# Patient Record
Sex: Male | Born: 1990 | Race: White | Hispanic: No | Smoking: Never smoker
Health system: Southern US, Community
[De-identification: ages and names within clinical notes are randomized; demographics above are authoritative.]

## PROBLEM LIST (undated history)

## (undated) DIAGNOSIS — E119 Type 2 diabetes mellitus without complications: Secondary | ICD-10-CM

## (undated) DIAGNOSIS — I10 Essential (primary) hypertension: Secondary | ICD-10-CM

## (undated) DIAGNOSIS — R569 Unspecified convulsions: Secondary | ICD-10-CM

## (undated) HISTORY — PX: FOREIGN BODY REMOVAL: SHX962

## (undated) HISTORY — PX: ANKLE SURGERY: SHX546

## (undated) HISTORY — PX: LEG SURGERY: SHX1003

---

## 2019-02-04 ENCOUNTER — Other Ambulatory Visit: Payer: Self-pay

## 2019-02-04 ENCOUNTER — Emergency Department (HOSPITAL_COMMUNITY)
Admission: EM | Admit: 2019-02-04 | Discharge: 2019-02-04 | Disposition: A | Discharge status: Home / Self Care | Payer: Self-pay | Attending: Emergency Medicine | Admitting: Emergency Medicine

## 2019-02-04 ENCOUNTER — Encounter (HOSPITAL_COMMUNITY): Payer: Self-pay | Admitting: Emergency Medicine

## 2019-02-04 DIAGNOSIS — L02512 Cutaneous abscess of left hand: Secondary | ICD-10-CM | POA: Insufficient documentation

## 2019-02-04 DIAGNOSIS — L0291 Cutaneous abscess, unspecified: Secondary | ICD-10-CM

## 2019-02-04 MED ORDER — DOXYCYCLINE HYCLATE 100 MG PO CAPS: 100.0000 mg | ORAL_CAPSULE | Freq: Two times a day (BID) | ORAL | 0 refills | Status: AC

## 2019-02-04 MED ORDER — LIDOCAINE HCL (PF) 2 % IJ SOLN
INTRAMUSCULAR | Status: AC
  Administered 2019-02-04: 5 mL via INTRADERMAL
  Filled 2019-02-04: qty 10

## 2019-02-04 MED ORDER — LIDOCAINE HCL (PF) 2 % IJ SOLN
5.0000 mL | Freq: Once | INTRAMUSCULAR | Status: AC
  Administered 2019-02-04: 5 mL via INTRADERMAL

## 2019-02-04 MED ORDER — HYDROCODONE-ACETAMINOPHEN 5-325 MG PO TABS: ORAL_TABLET | ORAL | 0 refills | Status: AC

## 2019-02-04 MED ORDER — POVIDONE-IODINE 10 % EX SOLN
CUTANEOUS | Status: DC | PRN
  Administered 2019-02-04: 1 via TOPICAL
  Filled 2019-02-04: qty 15

## 2019-02-04 NOTE — Discharge Instructions (Addendum)
Elevate your hand and apply warm wet compresses or warm water soaks 2-3 times a day.  Keep the area bandaged.  The antibiotic as directed until its finished.  Return to the ER for any worsening symptoms such as fever, chills, red streaking or increasing pain or swelling.

## 2019-02-04 NOTE — ED Triage Notes (Signed)
Pt c/o of abscess on left hand with swelling and redness.  Pt states " I think its a spider that bit me"

## 2019-02-04 NOTE — ED Notes (Signed)
Redness raise area to left hand with small scab area noted.

## 2019-02-05 NOTE — ED Provider Notes (Signed)
[facility] EMERGENCY DEPARTMENT Provider Note   CSN: [id number] Arrival date & time: 02/04/19  1100    History   Chief Complaint Chief Complaint  Patient presents with  . Abscess    HPI Mike Bennett is a 28 y.o. male.     HPI   Mike Bennett is a 28 y.o. male who presents to the Emergency Department complaining of a red raised area to the dorsal left hand.  States the area has been gradually increasing in size and pain for several days.  He states initially he woke up noticing the area to his hand and believes that he was bitten by a spider during his sleep.  He has been squeezing the area and attempted to drain the raised area with a razor blade.  He states since that time the area has increased in size.  He denies drainage, fever, chills, pain to his fingers or wrist, and difficulty moving his fingers or gripping.    History reviewed. No pertinent past medical history.  There are no active problems to display for this patient.   Past Surgical History:  Procedure Laterality Date  . ANKLE SURGERY    . FOREIGN BODY REMOVAL    . LEG SURGERY          Home Medications    Prior to Admission medications   Medication Sig Start Date End Date Taking? Authorizing Provider  doxycycline (VIBRAMYCIN) 100 MG capsule Take 1 capsule (100 mg total) by mouth 2 (two) times daily. 02/04/19   Keymora Grillot, PA-C  HYDROcodone-acetaminophen (NORCO/VICODIN) 5-325 MG tablet Take one tab po q 4 hrs prn pain 02/04/19   Pauline Aus, PA-C    Family History History reviewed. No pertinent family history.  Social History Social History   Tobacco Use  . Smoking status: Never Smoker  . Smokeless tobacco: Never Used  Substance Use Topics  . Alcohol use: Never    Frequency: Never  . Drug use: Yes    Types: Marijuana     Allergies   Patient has no allergy information on record.   Review of Systems Review of Systems  Constitutional: Negative for chills and fever.    Gastrointestinal: Negative for nausea and vomiting.  Musculoskeletal: Negative for arthralgias and joint swelling.       Raised red area of the dorsal left hand  Neurological: Negative for weakness and numbness.  Hematological: Negative for adenopathy.     Physical Exam Updated Vital Signs BP 124/71 (BP Location: Right Arm)   Pulse 84   Temp 98.1 F (36.7 C) (Oral)   Resp 16   Ht 6\' 1"  (1.854 m)   Wt 108.9 kg   SpO2 96%   BMI 31.66 kg/m   Physical Exam Vitals signs and nursing note reviewed.  Constitutional:      General: He is not in acute distress.    Appearance: He is well-developed. He is not ill-appearing.  Cardiovascular:     Rate and Rhythm: Normal rate and regular rhythm.     Pulses: Normal pulses.     Heart sounds: No murmur.  Pulmonary:     Effort: Pulmonary effort is normal. No respiratory distress.     Breath sounds: Normal breath sounds.  Musculoskeletal: Normal range of motion.        General: Tenderness present. No deformity.     Comments: 3 cm area of erythema and induration to the first webspace of the dorsal left hand.  Central scabbing is present.  No fluctuance.  Patient has full range of motion of the left wrist and fingers.  Normal finger thumb opposition.  No lymphangitis  Skin:    General: Skin is warm.  Neurological:     General: No focal deficit present.     Mental Status: He is alert.     Sensory: No sensory deficit.     Motor: No abnormal muscle tone.      ED Treatments / Results  Labs (all labs ordered are listed, but only abnormal results are displayed) Labs Reviewed - No data to display  EKG None  Radiology No results found.  Procedures Procedures (including critical care time)  INCISION AND DRAINAGE Performed by: Aarnav Steagall Consent: Verbal consent obtained. Risks and benefits: risks, benefits and alternatives were discussed Type: abscess  Body area: Left hand  Anesthesia: local infiltration  Incision was made  with a #11 scalpel.  Local anesthetic: lidocaine 2% % without epinephrine  Anesthetic total: 2 ml  Complexity: complex Blunt dissection to break up loculations  Drainage: purulent  Drainage amount: Small  Packing material: None Patient tolerance: Patient tolerated the procedure well with no immediate complications.     Medications Ordered in ED Medications  lidocaine (XYLOCAINE) 2 % injection 5 mL (5 mLs Intradermal Given 02/04/19 1229)     Initial Impression / Assessment and Plan / ED Course  I have reviewed the triage vital signs and the nursing notes.  Pertinent labs & imaging results that were available during my care of the patient were reviewed by me and considered in my medical decision making (see chart for details).        Small abscess to the dorsal left hand.  Appears localized.  No surrounding erythema.  Remains neurovascularly intact and he has full range of motion of the fingers and wrist.  Doubt tendon involvement.  Patient agrees to elevate, warm compresses, and antibiotics, he appears appropriate for discharge home and agrees to close outpatient follow-up.  Return precautions discussed.  Final Clinical Impressions(s) / ED Diagnoses   Final diagnoses:  Abscess    ED Discharge Orders         Ordered    doxycycline (VIBRAMYCIN) 100 MG capsule  2 times daily     02/04/19 1317    HYDROcodone-acetaminophen (NORCO/VICODIN) 5-325 MG tablet     02/04/19 1317           Pauline Aus, PA-C 02/05/19 2116    Benjiman Core, MD 02/07/19 [id number]

## 2019-02-11 ENCOUNTER — Emergency Department (HOSPITAL_COMMUNITY): Payer: Self-pay

## 2019-02-11 ENCOUNTER — Encounter (HOSPITAL_COMMUNITY): Payer: Self-pay | Admitting: Emergency Medicine

## 2019-02-11 ENCOUNTER — Other Ambulatory Visit: Payer: Self-pay

## 2019-02-11 ENCOUNTER — Emergency Department (HOSPITAL_COMMUNITY)
Admission: EM | Admit: 2019-02-11 | Discharge: 2019-02-12 | Disposition: A | Discharge status: Home / Self Care | Payer: Self-pay | Attending: Emergency Medicine | Admitting: Emergency Medicine

## 2019-02-11 DIAGNOSIS — E119 Type 2 diabetes mellitus without complications: Secondary | ICD-10-CM | POA: Insufficient documentation

## 2019-02-11 DIAGNOSIS — R51 Headache: Secondary | ICD-10-CM | POA: Insufficient documentation

## 2019-02-11 DIAGNOSIS — G40909 Epilepsy, unspecified, not intractable, without status epilepticus: Secondary | ICD-10-CM

## 2019-02-11 DIAGNOSIS — Z79899 Other long term (current) drug therapy: Secondary | ICD-10-CM | POA: Insufficient documentation

## 2019-02-11 DIAGNOSIS — S0993XA Unspecified injury of face, initial encounter: Secondary | ICD-10-CM

## 2019-02-11 DIAGNOSIS — I10 Essential (primary) hypertension: Secondary | ICD-10-CM | POA: Insufficient documentation

## 2019-02-11 DIAGNOSIS — R569 Unspecified convulsions: Secondary | ICD-10-CM | POA: Insufficient documentation

## 2019-02-11 HISTORY — DX: Type 2 diabetes mellitus without complications: E11.9

## 2019-02-11 HISTORY — DX: Unspecified convulsions: R56.9

## 2019-02-11 HISTORY — DX: Essential (primary) hypertension: I10

## 2019-02-11 MED ORDER — IBUPROFEN 400 MG PO TABS: 400.0000 mg | ORAL_TABLET | Freq: Four times a day (QID) | ORAL | 0 refills | Status: AC | PRN

## 2019-02-11 MED ORDER — IBUPROFEN 400 MG PO TABS
400.0000 mg | ORAL_TABLET | Freq: Once | ORAL | Status: AC
  Administered 2019-02-11: 400 mg via ORAL
  Filled 2019-02-11: qty 1

## 2019-02-11 MED ORDER — LEVETIRACETAM 500 MG PO TABS: 500.0000 mg | ORAL_TABLET | Freq: Two times a day (BID) | ORAL | 1 refills | Status: AC

## 2019-02-11 NOTE — ED Provider Notes (Signed)
[facility] EMERGENCY DEPARTMENT Provider Note   CSN: [id number] Arrival date & time: 02/11/19  1929    History   Chief Complaint Chief Complaint  Patient presents with  . Fall    HPI Mike Bennett is a 28 y.o. male.     HPI  The patient is a 28 year old male who admittedly states that he does not take his seizure medication because he does not like the way it makes him feel, he gets nauseated and throws up thus he has never taken any medications in quite some time.  He reports a history of being incarcerated in [place], states that he was kicked out of the state by Patent examiner and told never to return, he had been jailed here briefly for several months but is now out, overall doing well but still gets intermittent seizures.  He reports that these occur approximately twice a month and this evening he had a seizure which she describes as a petit mall seizure while he was sitting at the computer chair.  The next thing he knows he was on the floor waking up with pain at the left temporomandibular joint.  He denies headache vomitings blurred vision or any other complaints.  The pain is persistent and worse with opening and closing the mouth.  Past Medical History:  Diagnosis Date  . Diabetes mellitus without complication (HCC)   . Hypertension   . Seizures (HCC)     There are no active problems to display for this patient.   Past Surgical History:  Procedure Laterality Date  . ANKLE SURGERY    . FOREIGN BODY REMOVAL    . LEG SURGERY          Home Medications    Prior to Admission medications   Medication Sig Start Date End Date Taking? Authorizing Provider  doxycycline (VIBRAMYCIN) 100 MG capsule Take 1 capsule (100 mg total) by mouth 2 (two) times daily. 02/04/19  Yes Triplett, Tammy, PA-C  HYDROcodone-acetaminophen (NORCO/VICODIN) 5-325 MG tablet Take one tab po q 4 hrs prn pain 02/04/19   Triplett, Tammy, PA-C  ibuprofen (ADVIL,MOTRIN) 400 MG tablet Take 1 tablet  (400 mg total) by mouth every 6 (six) hours as needed. 02/11/19   Eber Hong, MD  levETIRAcetam (KEPPRA) 500 MG tablet Take 1 tablet (500 mg total) by mouth 2 (two) times daily. 02/11/19   Eber Hong, MD    Family History History reviewed. No pertinent family history.  Social History Social History   Tobacco Use  . Smoking status: Never Smoker  . Smokeless tobacco: Never Used  Substance Use Topics  . Alcohol use: Never    Frequency: Never  . Drug use: Yes    Types: Marijuana     Allergies   Morphine and related and Sulfa antibiotics   Review of Systems Review of Systems  All other systems reviewed and are negative.    Physical Exam Updated Vital Signs BP 134/83 (BP Location: Right Arm)   Pulse 85   Temp 98 F (36.7 C) (Oral)   Resp 17   Ht 1.829 m (6')   Wt 99.8 kg   SpO2 97%   BMI 29.84 kg/m   Physical Exam Vitals signs and nursing note reviewed.  Constitutional:      General: He is not in acute distress.    Appearance: He is well-developed.  HENT:     Head: Normocephalic and atraumatic.     Comments: No signs of outward trauma, the patient is able to  completely open and close the mouth without difficulty, he does not have any clicking or abnormal appearance of the jaw when he opens and closes, no obvious malocclusion though he does state that things are not lining up as they normally do.  He has tenderness over the left temporomandibular joint with opening and closing and when he is closed with palpation.    Mouth/Throat:     Pharynx: No oropharyngeal exudate.  Eyes:     General: No scleral icterus.       Right eye: No discharge.        Left eye: No discharge.     Conjunctiva/sclera: Conjunctivae normal.     Pupils: Pupils are equal, round, and reactive to light.  Neck:     Musculoskeletal: Normal range of motion and neck supple.     Thyroid: No thyromegaly.     Vascular: No JVD.  Cardiovascular:     Rate and Rhythm: Normal rate and regular rhythm.      Heart sounds: Normal heart sounds. No murmur. No friction rub. No gallop.   Pulmonary:     Effort: Pulmonary effort is normal. No respiratory distress.     Breath sounds: Normal breath sounds. No wheezing or rales.  Abdominal:     General: Bowel sounds are normal. There is no distension.     Palpations: Abdomen is soft. There is no mass.     Tenderness: There is no abdominal tenderness.  Musculoskeletal: Normal range of motion.        General: No tenderness.  Lymphadenopathy:     Cervical: No cervical adenopathy.  Skin:    General: Skin is warm and dry.     Findings: No erythema or rash.  Neurological:     Mental Status: He is alert.     Coordination: Coordination normal.  Psychiatric:        Behavior: Behavior normal.      ED Treatments / Results  Labs (all labs ordered are listed, but only abnormal results are displayed) Labs Reviewed - No data to display  EKG None  Radiology Ct Maxillofacial Wo Contrast  Result Date: 02/11/2019 CLINICAL DATA:  Left TMJ pain after fall and seizure. No bruising of the face. EXAM: CT MAXILLOFACIAL WITHOUT CONTRAST TECHNIQUE: Multidetector CT imaging of the maxillofacial structures was performed. Multiplanar CT image reconstructions were also generated. COMPARISON:  None. FINDINGS: Osseous: Intact bilateral temporomandibular joints. No joint dislocation. Mandible appears intact. Probable cleft undermining the anterior nasal spine of the maxilla versus incomplete fracture. Correlate for pain over the anterior nasal spine of the maxilla, series 7/49. Orbits: Intact Sinuses: Intact Soft tissues: No soft tissue hematoma or fluid collections. There appears to be some mild soft tissue swelling overlying the maxilla. Limited intracranial: Negative IMPRESSION: 1. Intact temporomandibular joints. 2. Equivocal incomplete fracture versus cleft of the anterior nasal spine of the maxilla. Electronically Signed   By: Tollie Eth M.D.   On: 02/11/2019 21:35     Procedures Procedures (including critical care time)  Medications Ordered in ED Medications  ibuprofen (ADVIL,MOTRIN) tablet 400 mg (400 mg Oral Given 02/11/19 2044)     Initial Impression / Assessment and Plan / ED Course  I have reviewed the triage vital signs and the nursing notes.  Pertinent labs & imaging results that were available during my care of the patient were reviewed by me and considered in my medical decision making (see chart for details).  Clinical Course as of Feb 10 2302  Wynelle Link Feb 11, 2019  2301 X-rays show no signs of fracture of the mandible or the TMJ joint, there is a possible fracture of the nasal spine but the patient is not tender over this area.  Stable for discharge   [BM]    Clinical Course User Index [BM] Eber Hong, MD      Other than some facial injury there does not appear to be any other signs of trauma, he has a normal mental status at this time.  We will offer the patient anticonvulsants, unfortunately he declines and states he will not take them.  Precautions were given about driving and the serious outcomes with ongoing seizures but he still declines.  He has normal mental status and medical decision-making capacity.  We will obtain x-rays of the jaw to make sure there is no fractures.  Final Clinical Impressions(s) / ED Diagnoses   Final diagnoses:  Injury of jaw, initial encounter  Seizure disorder [facility])    ED Discharge Orders         Ordered    levETIRAcetam (KEPPRA) 500 MG tablet  2 times daily     02/11/19 2302    ibuprofen (ADVIL,MOTRIN) 400 MG tablet  Every 6 hours PRN     02/11/19 2302           Eber Hong, MD 02/11/19 2303

## 2019-02-11 NOTE — ED Notes (Signed)
Pt reports following seizure today, no onset of confusion, disorientation, but unsure what exactly he hit while falling to floor no incontinence.

## 2019-02-11 NOTE — ED Triage Notes (Signed)
Pt reports falling hitting a wooden desk and then the floor while having a seizure at 1700 tonight, pt reporting pain to face/upper jaw since fall

## 2019-02-11 NOTE — Discharge Instructions (Signed)
Thank you for letting us take care of you today!  Please obtain all of your results from medical records or have your doctors office obtain the results - share them with your doctor - you should be seen at your doctors office in the next 2 days. Call today to arrange your follow up. Take the medications as prescribed. Please review all of the medicines and only take them if you do not have an allergy to them. Please be aware that if you are taking birth control pills, taking other prescriptions, ESPECIALLY ANTIBIOTICS may make the birth control ineffective - if this is the case, either do not engage in sexual activity or use alternative methods of birth control such as condoms until you have finished the medicine and your family doctor says it is OK to restart them. If you are on a blood thinner such as COUMADIN, be aware that any other medicine that you take may cause the coumadin to either work too much, or not enough - you should have your coumadin level rechecked in next 7 days if this is the case.  ?  It is also a possibility that you have an allergic reaction to any of the medicines that you have been prescribed - Everybody reacts differently to medications and while MOST people have no trouble with most medicines, you may have a reaction such as nausea, vomiting, rash, swelling, shortness of breath. If this is the case, please stop taking the medicine immediately and contact your physician.   If you were given a medication in the ED such as percocet, vicodin, or morphine, be aware that these medicines are sedating and may change your ability to take care of yourself adequately for several hours after being given this medicines - you should not drive or take care of small children if you were given this medicine in the Emergency Department or if you have been prescribed these types of medicines. ?   You should return to the ER IMMEDIATELY if you develop severe or worsening symptoms.   [facility]  Primary Care Doctor List    Kari Baars MD. Specialty: Pulmonary Disease Contact information: [address]  [address]  [place] [address]  [phone]   Syliva Overman, MD. Specialty: [facility] Medicine Contact information: [address]  [place] [address]  [phone]   Lilyan Punt, MD. Specialty: [facility] Medicine Contact information: [address]  [place] [address]  [phone]   Avon Gully, MD Specialty: Internal Medicine Contact information: [address] [place] [address]  [phone]   Catalina Pizza, MD. Specialty: Internal Medicine Contact information: [address]  [place] [address]  [phone]    [facility] (Dr. Selena Batten) Specialty: Family Medicine Contact information: [address]  [place] [address]  [phone]   John Giovanni, MD. Specialty: [facility] Medicine Contact information: [address]  [address]  [place] [address]  [phone]   Carylon Perches, MD. Specialty: Internal Medicine Contact information: [address]  [address]  [place] [address]  [phone]    [facility]  [address] [place], [place] [address] [phone]  Services [facility] offers a variety of basic health services.  Services include but are not limited to: Blood pressure checks  Heart rate checks  Blood sugar checks  Urine analysis  Rapid strep tests  Pregnancy tests.  Health education and referrals  People needing more complex services will be directed to a physician online. Using these virtual visits, doctors can evaluate and prescribe medicine and treatments. There will be no medication on-site, though [facility] will help patients fill their prescriptions at little to no cost.   For More information please go  to: [email or web address]   You do not have any broken bones, just bruises.  These take Keppra twice a day to help with seizures, if you do not this may continue to be a problem and can cause severe injuries including death.  Ibuprofen and ice for your jaw.  See the list of doctors above for follow-up

## 2019-02-11 NOTE — ED Notes (Signed)
ED Provider at bedside. 

## 2019-10-27 IMAGING — CT CT MAXILLOFACIAL W/O CM
3 series · 16 of 47 positions shown, 19 images · non-contrast
Comparison: None.

CLINICAL DATA: Left TMJ pain after fall and seizure. No bruising of
the face.

EXAM:
CT MAXILLOFACIAL WITHOUT CONTRAST
TECHNIQUE: Multidetector CT imaging of the maxillofacial structures was
performed. Multiplanar CT image reconstructions were also generated.

[Series 2: max soft · axial · 0.37mm/px · z∈[-190,-16]mm · 10 of 103 slices shown, 13 images]
[im 8/103  brain]
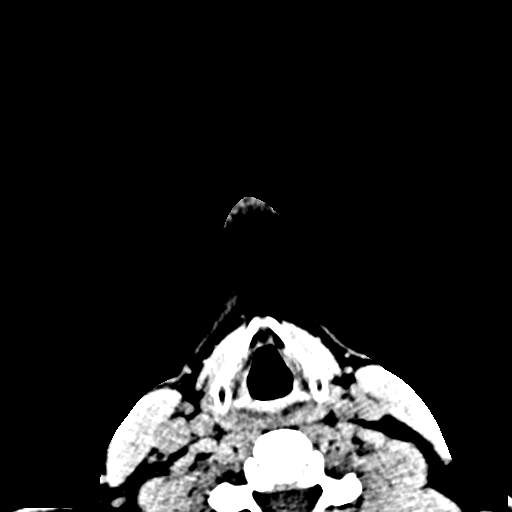
[im 8/103  bone]
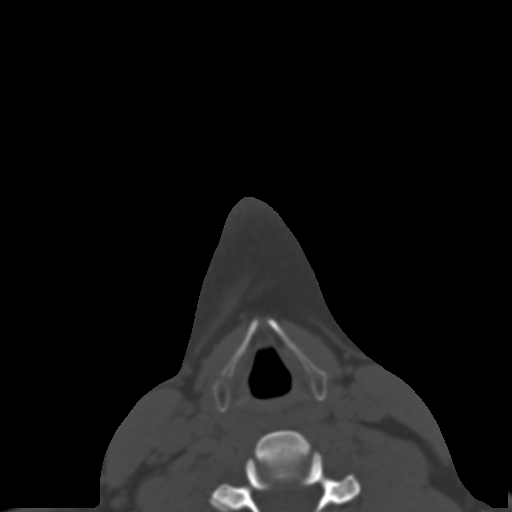
[im 18/103  bone]
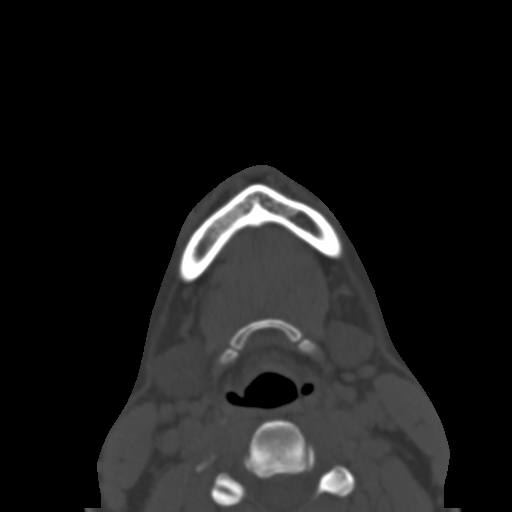
[im 29/103  bone]
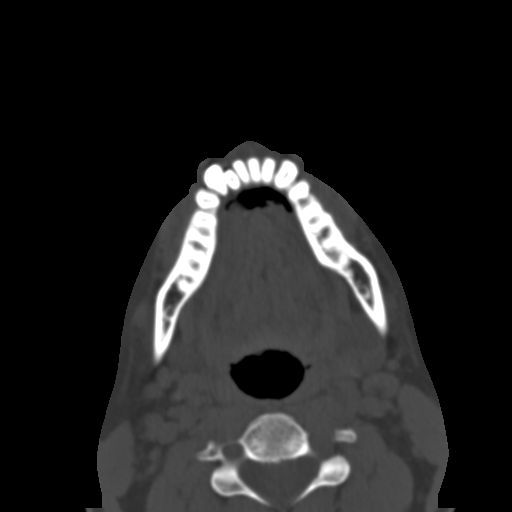
[im 36/103  bone]
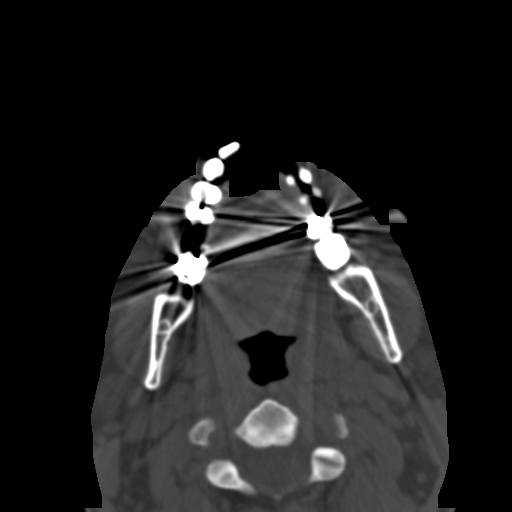
[im 46/103  brain]
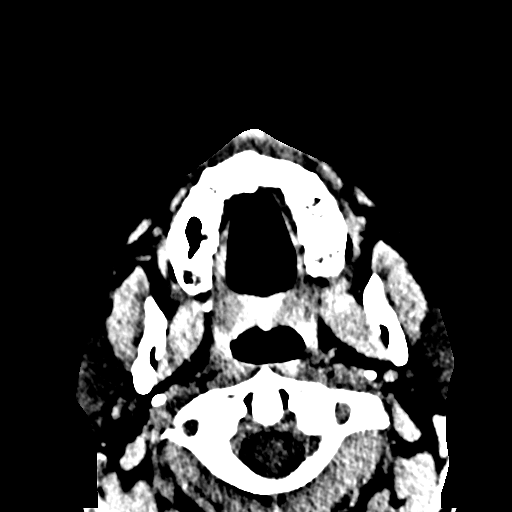
[im 46/103  bone]
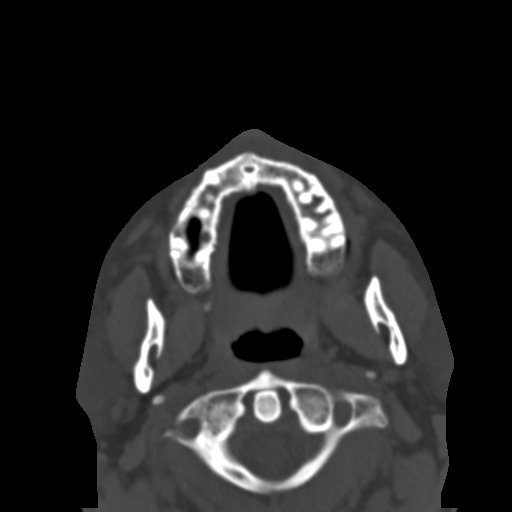
[im 57/103  bone]
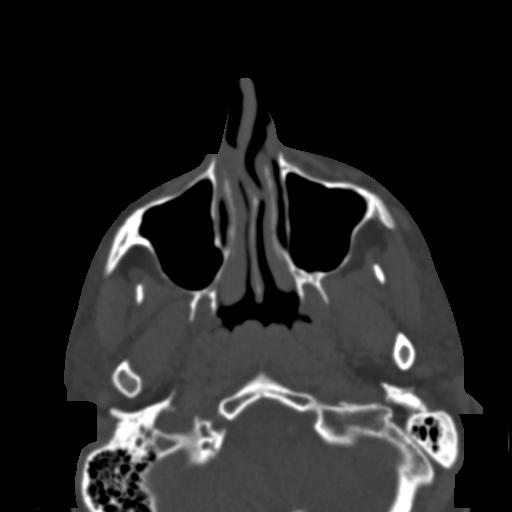
[im 67/103  bone]
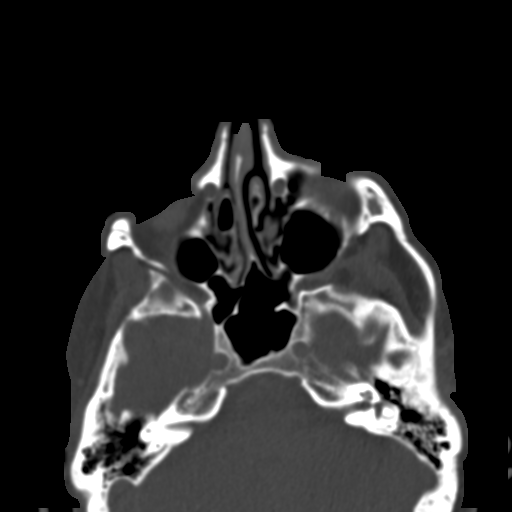
[im 78/103  bone]
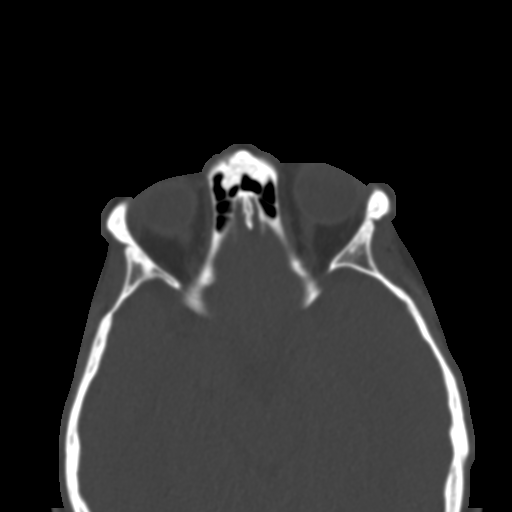
[im 85/103  brain]
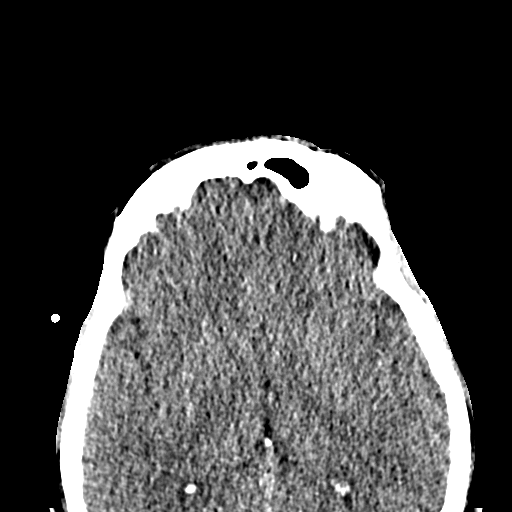
[im 85/103  bone]
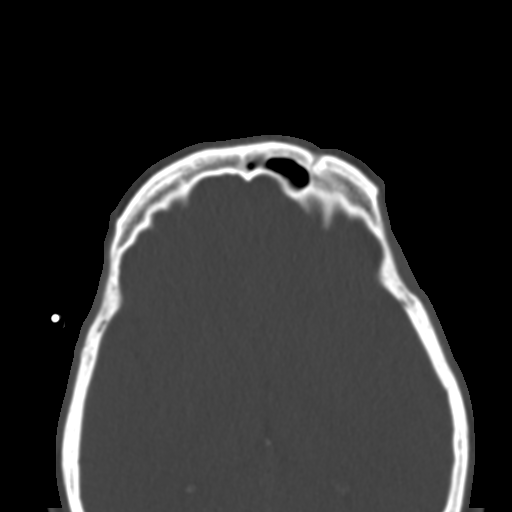
[im 95/103  bone]
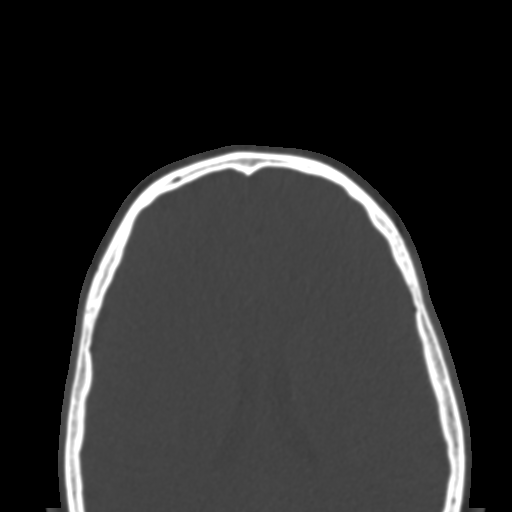

[Series 6: coronal soft · coronal · 0.37mm/px · 3 of 97 slices shown]
[im 33/97  bone]
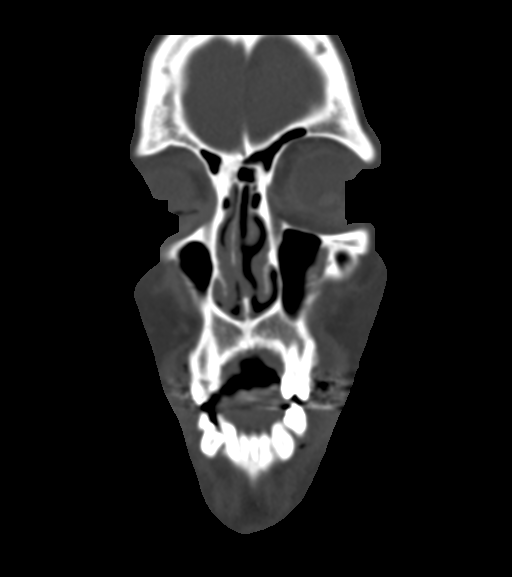
[im 43/97  bone]
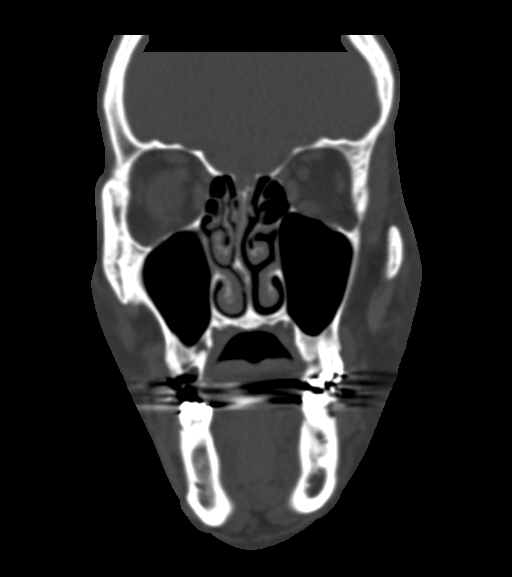
[im 54/97  bone]
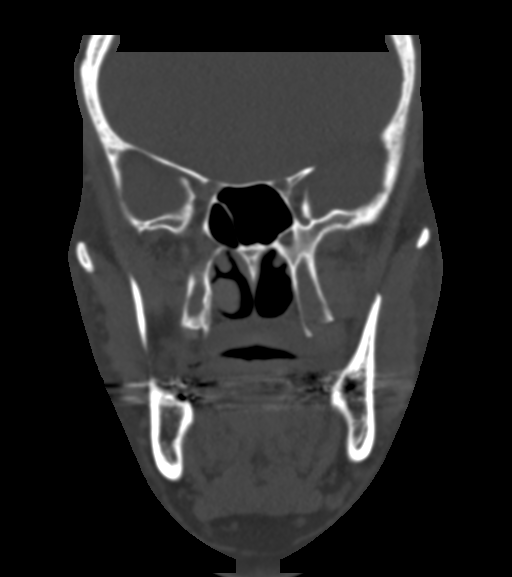

[Series 7: sagittal soft · sagittal · 0.38mm/px · 3 of 97 slices shown]
[im 33/97  bone]
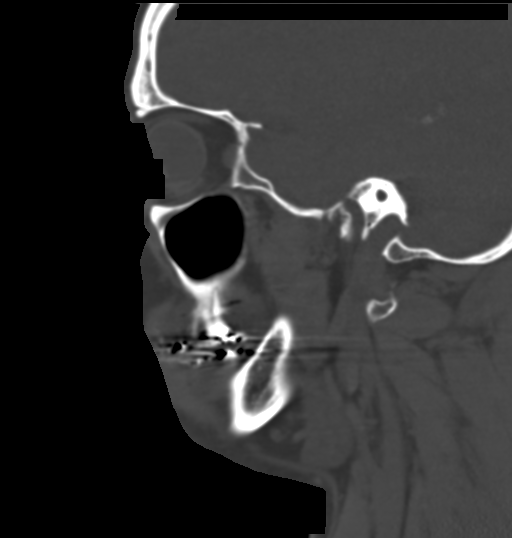
[im 49/97  bone]
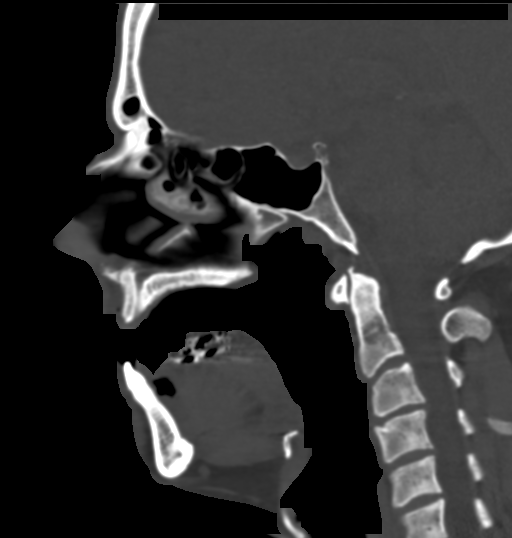
[im 65/97  bone]
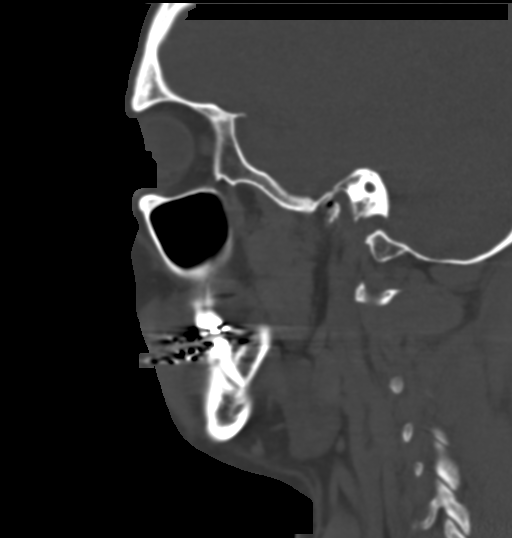

[16 of 47 positions shown; findings below may reference images not displayed]

FINDINGS: Osseous: Intact bilateral temporomandibular joints. No joint
dislocation. Mandible appears intact. Probable cleft undermining the
anterior nasal spine of the maxilla versus incomplete fracture.
Correlate for pain over the anterior nasal spine of the maxilla,
series [DATE].

Orbits: Intact

Sinuses: Intact

Soft tissues: No soft tissue hematoma or fluid collections. There
appears to be some mild soft tissue swelling overlying the maxilla.

Limited intracranial: Negative
IMPRESSION: 1. Intact temporomandibular joints.
2. Equivocal incomplete fracture versus cleft of the anterior nasal
spine of the maxilla.
